# Patient Record
Sex: Male | Born: 1970 | Race: White | Hispanic: No | Marital: Married | Smoking: Current every day smoker
Health system: Southern US, Community
[De-identification: ages and names within clinical notes are randomized; demographics above are authoritative.]

## PROBLEM LIST (undated history)

## (undated) DIAGNOSIS — K219 Gastro-esophageal reflux disease without esophagitis: Secondary | ICD-10-CM

## (undated) DIAGNOSIS — J449 Chronic obstructive pulmonary disease, unspecified: Secondary | ICD-10-CM

## (undated) DIAGNOSIS — G4730 Sleep apnea, unspecified: Secondary | ICD-10-CM

## (undated) DIAGNOSIS — I10 Essential (primary) hypertension: Secondary | ICD-10-CM

## (undated) HISTORY — PX: NO PAST SURGERIES: SHX2092

---

## 2012-02-14 DIAGNOSIS — E291 Testicular hypofunction: Secondary | ICD-10-CM | POA: Insufficient documentation

## 2012-02-14 DIAGNOSIS — N529 Male erectile dysfunction, unspecified: Secondary | ICD-10-CM | POA: Insufficient documentation

## 2015-09-02 ENCOUNTER — Other Ambulatory Visit: Payer: Self-pay | Admitting: Family Medicine

## 2015-09-02 ENCOUNTER — Ambulatory Visit
Admission: RE | Admit: 2015-09-02 | Discharge: 2015-09-02 | Disposition: A | Discharge status: Home / Self Care | Payer: 59 | Source: Ambulatory Visit | Attending: Family Medicine | Admitting: Family Medicine

## 2015-09-02 DIAGNOSIS — M7989 Other specified soft tissue disorders: Secondary | ICD-10-CM | POA: Diagnosis present

## 2015-09-02 DIAGNOSIS — I8001 Phlebitis and thrombophlebitis of superficial vessels of right lower extremity: Secondary | ICD-10-CM | POA: Diagnosis not present

## 2015-09-02 DIAGNOSIS — G8929 Other chronic pain: Secondary | ICD-10-CM | POA: Insufficient documentation

## 2017-08-16 ENCOUNTER — Ambulatory Visit
Admission: RE | Admit: 2017-08-16 | Discharge: 2017-08-16 | Disposition: A | Discharge status: Home / Self Care | Payer: BLUE CROSS/BLUE SHIELD | Source: Ambulatory Visit | Attending: Family Medicine | Admitting: Family Medicine

## 2017-08-16 ENCOUNTER — Other Ambulatory Visit: Payer: Self-pay | Admitting: Family Medicine

## 2017-08-16 DIAGNOSIS — M62830 Muscle spasm of back: Secondary | ICD-10-CM | POA: Diagnosis present

## 2017-08-16 DIAGNOSIS — M5137 Other intervertebral disc degeneration, lumbosacral region: Secondary | ICD-10-CM | POA: Insufficient documentation

## 2017-08-24 DIAGNOSIS — M47818 Spondylosis without myelopathy or radiculopathy, sacral and sacrococcygeal region: Secondary | ICD-10-CM | POA: Insufficient documentation

## 2017-08-30 ENCOUNTER — Other Ambulatory Visit: Payer: Self-pay | Admitting: Physical Medicine and Rehabilitation

## 2017-08-30 DIAGNOSIS — M5416 Radiculopathy, lumbar region: Secondary | ICD-10-CM

## 2017-09-02 ENCOUNTER — Ambulatory Visit
Admission: RE | Admit: 2017-09-02 | Discharge: 2017-09-02 | Disposition: A | Discharge status: Home / Self Care | Payer: BLUE CROSS/BLUE SHIELD | Source: Ambulatory Visit | Attending: Physical Medicine and Rehabilitation | Admitting: Physical Medicine and Rehabilitation

## 2017-09-02 DIAGNOSIS — M5416 Radiculopathy, lumbar region: Secondary | ICD-10-CM | POA: Diagnosis present

## 2017-09-02 DIAGNOSIS — M4316 Spondylolisthesis, lumbar region: Secondary | ICD-10-CM | POA: Diagnosis not present

## 2017-09-02 DIAGNOSIS — M48061 Spinal stenosis, lumbar region without neurogenic claudication: Secondary | ICD-10-CM | POA: Diagnosis not present

## 2017-09-02 DIAGNOSIS — M5116 Intervertebral disc disorders with radiculopathy, lumbar region: Secondary | ICD-10-CM | POA: Insufficient documentation

## 2017-09-11 ENCOUNTER — Ambulatory Visit: Payer: BLUE CROSS/BLUE SHIELD

## 2017-09-20 ENCOUNTER — Other Ambulatory Visit: Payer: Self-pay

## 2017-09-20 ENCOUNTER — Ambulatory Visit
Admission: RE | Admit: 2017-09-20 | Discharge: 2017-09-20 | Disposition: A | Discharge status: Home / Self Care | Payer: BLUE CROSS/BLUE SHIELD | Source: Ambulatory Visit | Attending: Neurological Surgery | Admitting: Neurological Surgery

## 2017-09-20 ENCOUNTER — Encounter
Admission: RE | Admit: 2017-09-20 | Discharge: 2017-09-20 | Disposition: A | Discharge status: Home / Self Care | Payer: BLUE CROSS/BLUE SHIELD | Source: Ambulatory Visit | Attending: Neurological Surgery | Admitting: Neurological Surgery

## 2017-09-20 DIAGNOSIS — Z01810 Encounter for preprocedural cardiovascular examination: Secondary | ICD-10-CM | POA: Diagnosis present

## 2017-09-20 DIAGNOSIS — F17200 Nicotine dependence, unspecified, uncomplicated: Secondary | ICD-10-CM | POA: Diagnosis not present

## 2017-09-20 DIAGNOSIS — I10 Essential (primary) hypertension: Secondary | ICD-10-CM | POA: Insufficient documentation

## 2017-09-20 DIAGNOSIS — J449 Chronic obstructive pulmonary disease, unspecified: Secondary | ICD-10-CM | POA: Diagnosis not present

## 2017-09-20 DIAGNOSIS — Z01812 Encounter for preprocedural laboratory examination: Secondary | ICD-10-CM | POA: Diagnosis present

## 2017-09-20 DIAGNOSIS — G4730 Sleep apnea, unspecified: Secondary | ICD-10-CM | POA: Diagnosis not present

## 2017-09-20 DIAGNOSIS — I4510 Unspecified right bundle-branch block: Secondary | ICD-10-CM | POA: Diagnosis not present

## 2017-09-20 DIAGNOSIS — Z01818 Encounter for other preprocedural examination: Secondary | ICD-10-CM | POA: Insufficient documentation

## 2017-09-20 DIAGNOSIS — K219 Gastro-esophageal reflux disease without esophagitis: Secondary | ICD-10-CM | POA: Insufficient documentation

## 2017-09-20 HISTORY — DX: Sleep apnea, unspecified: G47.30

## 2017-09-20 HISTORY — DX: Chronic obstructive pulmonary disease, unspecified: J44.9

## 2017-09-20 HISTORY — DX: Essential (primary) hypertension: I10

## 2017-09-20 HISTORY — DX: Gastro-esophageal reflux disease without esophagitis: K21.9

## 2017-09-20 LAB — BASIC METABOLIC PANEL
Anion gap: 8 (ref 5–15)
BUN: 15 mg/dL (ref 6–20)
CALCIUM: 9.6 mg/dL (ref 8.9–10.3)
CO2: 26 mmol/L (ref 22–32)
Chloride: 106 mmol/L (ref 98–111)
Creatinine, Ser: 1.03 mg/dL (ref 0.61–1.24)
GFR calc Af Amer: >60 mL/min (ref >60)
GLUCOSE: 101 mg/dL — AB (ref 70–99)
POTASSIUM: 4.5 mmol/L (ref 3.5–5.1)
SODIUM: 140 mmol/L (ref 135–145)

## 2017-09-20 LAB — TYPE AND SCREEN
ABO/RH(D): A POS
Antibody Screen: NEGATIVE

## 2017-09-20 LAB — CBC
HCT: 44.5 % (ref 40.0–52.0)
HEMOGLOBIN: 15.2 g/dL (ref 13.0–18.0)
MCH: 30.9 pg (ref 26.0–34.0)
MCHC: 34.3 g/dL (ref 32.0–36.0)
MCV: 90.2 fL (ref 80.0–100.0)
PLATELETS: 330 10*3/uL (ref 150–440)
RBC: 4.93 MIL/uL (ref 4.40–5.90)
RDW: 13.3 % (ref 11.5–14.5)
WBC: 8.4 10*3/uL (ref 3.8–10.6)

## 2017-09-20 LAB — URINALYSIS, ROUTINE W REFLEX MICROSCOPIC
BILIRUBIN URINE: NEGATIVE
GLUCOSE, UA: NEGATIVE mg/dL
HGB URINE DIPSTICK: NEGATIVE
Ketones, ur: NEGATIVE mg/dL
Leukocytes, UA: NEGATIVE
Nitrite: NEGATIVE
Protein, ur: NEGATIVE mg/dL
SPECIFIC GRAVITY, URINE: 1.021 (ref 1.005–1.030)
pH: 5 (ref 5.0–8.0)

## 2017-09-20 LAB — PROTIME-INR
INR: 0.99
PROTHROMBIN TIME: 13 s (ref 11.4–15.2)

## 2017-09-20 LAB — SURGICAL PCR SCREEN
MRSA, PCR: NEGATIVE
Staphylococcus aureus: NEGATIVE

## 2017-09-20 LAB — APTT: APTT: 28 s (ref 24–36)

## 2017-09-20 NOTE — Patient Instructions (Signed)
Your procedure is scheduled on:09/25/17 Report to Day Surgery. [facility] SECOND FLOOR To find out your arrival time please call [phone] between 1PM - 3PM on 09/22/17  Remember: Instructions that are not followed completely may result in serious medical risk,  up to and including death, or upon the discretion of your surgeon and anesthesiologist your  surgery may need to be rescheduled.     _X__ 1. Do not eat food after midnight the night before your procedure.                 No gum chewing or hard candies. You may drink clear liquids up to 2 hours                 before you are scheduled to arrive for your surgery- DO not drink clear                 liquids within 2 hours of the start of your surgery.                 Clear Liquids include:  water, apple juice without pulp, clear carbohydrate                 drink such as Clearfast of Gatorade, Black Coffee or Tea (Do not add                 anything to coffee or tea).  __X__2.  On the morning of surgery brush your teeth with toothpaste and water, you                may rinse your mouth with mouthwash if you wish.  Do not swallow any toothpaste of mouthwash.     _X__ 3.  No Alcohol for 24 hours before or after surgery.   _X__ 4.  Do Not Smoke or use e-cigarettes For 24 Hours Prior to Your Surgery.                 Do not use any chewable tobacco products for at least 6 hours prior to                 surgery.  ____  5.  Bring all medications with you on the day of surgery if instructed.   __X__  6.  Notify your doctor if there is any change in your medical condition      (cold, fever, infections).     Do not wear jewelry, make-up, hairpins, clips or nail polish. Do not wear lotions, powders, or perfumes. You may wear deodorant. Do not shave 48 hours prior to surgery. Men may shave face and neck. Do not bring valuables to the hospital.    [facility] is not responsible for any belongings or  valuables.  Contacts, dentures or bridgework may not be worn into surgery. Leave your suitcase in the car. After surgery it may be brought to your room. For patients admitted to the hospital, discharge time is determined by your treatment team.   Patients discharged the day of surgery will not be allowed to drive home.   Please read over the following fact sheets that you were given:   Surgical Site Infection Prevention / MRSA   _X___ Take these medicines the morning of surgery with A SIP OF WATER:    1.GEMFIBROZIL  2. ZANTAC AT BEDTIME 09/24/17 AND AM OF SURGERY  3.   4.  5.  6.  ____ Fleet Enema (as directed)  __X__ Use CHG Soap as directed  _X___ Use inhalers on the day of surgery  AND BRING  ____ Stop metformin 2 days prior to surgery    ____ Take 1/2 of usual insulin dose the night before surgery. No insulin the morning          of surgery.   __X__ Stop Coumadin/Plavix/aspirin on   STOP ASPIRIN 09/20/17  __X__ Stop Anti-inflammatories on   STOP IBUPROFEN 09/20/17   ____ Stop supplements until after surgery.    _X___ Bring C-Pap to the hospital.

## 2017-09-25 ENCOUNTER — Ambulatory Visit: Payer: BLUE CROSS/BLUE SHIELD

## 2017-09-25 ENCOUNTER — Encounter
Admission: RE | Disposition: A | Discharge status: Home / Self Care | Payer: Self-pay | Source: Ambulatory Visit | Attending: Neurological Surgery

## 2017-09-25 ENCOUNTER — Ambulatory Visit
Admission: RE | Admit: 2017-09-25 | Discharge: 2017-09-25 | Disposition: A | Discharge status: Home / Self Care | Payer: BLUE CROSS/BLUE SHIELD | Source: Ambulatory Visit | Attending: Neurological Surgery | Admitting: Neurological Surgery

## 2017-09-25 ENCOUNTER — Encounter: Payer: Self-pay | Admitting: *Deleted

## 2017-09-25 ENCOUNTER — Ambulatory Visit: Payer: BLUE CROSS/BLUE SHIELD | Admitting: Anesthesiology

## 2017-09-25 DIAGNOSIS — K219 Gastro-esophageal reflux disease without esophagitis: Secondary | ICD-10-CM | POA: Diagnosis not present

## 2017-09-25 DIAGNOSIS — F17210 Nicotine dependence, cigarettes, uncomplicated: Secondary | ICD-10-CM | POA: Diagnosis not present

## 2017-09-25 DIAGNOSIS — M5116 Intervertebral disc disorders with radiculopathy, lumbar region: Secondary | ICD-10-CM | POA: Insufficient documentation

## 2017-09-25 DIAGNOSIS — I10 Essential (primary) hypertension: Secondary | ICD-10-CM | POA: Insufficient documentation

## 2017-09-25 DIAGNOSIS — Z79899 Other long term (current) drug therapy: Secondary | ICD-10-CM | POA: Diagnosis not present

## 2017-09-25 DIAGNOSIS — E785 Hyperlipidemia, unspecified: Secondary | ICD-10-CM | POA: Diagnosis not present

## 2017-09-25 DIAGNOSIS — Z419 Encounter for procedure for purposes other than remedying health state, unspecified: Secondary | ICD-10-CM

## 2017-09-25 DIAGNOSIS — J449 Chronic obstructive pulmonary disease, unspecified: Secondary | ICD-10-CM | POA: Insufficient documentation

## 2017-09-25 HISTORY — PX: LUMBAR LAMINECTOMY/DECOMPRESSION MICRODISCECTOMY: SHX5026

## 2017-09-25 LAB — URINE DRUG SCREEN, QUALITATIVE (ARMC ONLY)
Amphetamines, Ur Screen: NOT DETECTED
Benzodiazepine, Ur Scrn: NOT DETECTED
COCAINE METABOLITE, UR ~~LOC~~: NOT DETECTED
Cannabinoid 50 Ng, Ur ~~LOC~~: NOT DETECTED
MDMA (ECSTASY) UR SCREEN: NOT DETECTED
Methadone Scn, Ur: NOT DETECTED
Opiate, Ur Screen: NOT DETECTED
Phencyclidine (PCP) Ur S: NOT DETECTED
TRICYCLIC, UR SCREEN: NOT DETECTED

## 2017-09-25 LAB — ABO/RH: ABO/RH(D): A POS

## 2017-09-25 SURGERY — LUMBAR LAMINECTOMY/DECOMPRESSION MICRODISCECTOMY 1 LEVEL
Anesthesia: General | Site: Back | Wound class: Clean

## 2017-09-25 MED ORDER — BUPIVACAINE-EPINEPHRINE (PF) 0.5% -1:200000 IJ SOLN
INTRAMUSCULAR | Status: AC
  Filled 2017-09-25: qty 30

## 2017-09-25 MED ORDER — PROPOFOL 10 MG/ML IV BOLUS
INTRAVENOUS | Status: AC
  Filled 2017-09-25: qty 20

## 2017-09-25 MED ORDER — REMIFENTANIL HCL 1 MG IV SOLR
INTRAVENOUS | Status: DC | PRN
  Administered 2017-09-25: .05 ug/kg/min via INTRAVENOUS

## 2017-09-25 MED ORDER — METHOCARBAMOL 500 MG PO TABS: 500.0000 mg | ORAL_TABLET | Freq: Four times a day (QID) | ORAL | 0 refills | Status: DC | PRN

## 2017-09-25 MED ORDER — OXYCODONE HCL 5 MG PO TABS: 5.0000 mg | ORAL_TABLET | ORAL | 0 refills | Status: DC | PRN

## 2017-09-25 MED ORDER — ONDANSETRON HCL 4 MG/2ML IJ SOLN
INTRAMUSCULAR | Status: DC | PRN
  Administered 2017-09-25: 4 mg via INTRAVENOUS

## 2017-09-25 MED ORDER — LIDOCAINE HCL (PF) 2 % IJ SOLN
INTRAMUSCULAR | Status: AC
  Filled 2017-09-25: qty 10

## 2017-09-25 MED ORDER — SUCCINYLCHOLINE CHLORIDE 20 MG/ML IJ SOLN
INTRAMUSCULAR | Status: DC | PRN
  Administered 2017-09-25: 120 mg via INTRAVENOUS

## 2017-09-25 MED ORDER — SUGAMMADEX SODIUM 200 MG/2ML IV SOLN
INTRAVENOUS | Status: DC | PRN
  Administered 2017-09-25: 200 mg via INTRAVENOUS

## 2017-09-25 MED ORDER — FENTANYL CITRATE (PF) 100 MCG/2ML IJ SOLN
INTRAMUSCULAR | Status: DC | PRN
  Administered 2017-09-25 (×3): 50 ug via INTRAVENOUS

## 2017-09-25 MED ORDER — OXYCODONE HCL 5 MG PO TABS
ORAL_TABLET | ORAL | Status: AC
  Administered 2017-09-25: 5 mg
  Filled 2017-09-25: qty 1

## 2017-09-25 MED ORDER — LACTATED RINGERS IV SOLN
INTRAVENOUS | Status: DC
  Administered 2017-09-25 (×2): via INTRAVENOUS

## 2017-09-25 MED ORDER — REMIFENTANIL HCL 1 MG IV SOLR
INTRAVENOUS | Status: AC
  Filled 2017-09-25: qty 1000

## 2017-09-25 MED ORDER — ROCURONIUM BROMIDE 100 MG/10ML IV SOLN
INTRAVENOUS | Status: DC | PRN
  Administered 2017-09-25 (×2): 20 mg via INTRAVENOUS
  Administered 2017-09-25: 10 mg via INTRAVENOUS
  Administered 2017-09-25: 30 mg via INTRAVENOUS
  Administered 2017-09-25: 10 mg via INTRAVENOUS

## 2017-09-25 MED ORDER — ROCURONIUM BROMIDE 50 MG/5ML IV SOLN
INTRAVENOUS | Status: AC
  Filled 2017-09-25: qty 2

## 2017-09-25 MED ORDER — THROMBIN 5000 UNITS EX SOLR
CUTANEOUS | Status: AC
  Filled 2017-09-25: qty 5000

## 2017-09-25 MED ORDER — FENTANYL CITRATE (PF) 100 MCG/2ML IJ SOLN
INTRAMUSCULAR | Status: AC
  Administered 2017-09-25: 25 ug via INTRAVENOUS
  Filled 2017-09-25: qty 2

## 2017-09-25 MED ORDER — ACETAMINOPHEN 10 MG/ML IV SOLN
INTRAVENOUS | Status: AC
  Filled 2017-09-25: qty 100

## 2017-09-25 MED ORDER — FENTANYL CITRATE (PF) 100 MCG/2ML IJ SOLN
INTRAMUSCULAR | Status: AC
  Filled 2017-09-25: qty 2

## 2017-09-25 MED ORDER — MIDAZOLAM HCL 2 MG/2ML IJ SOLN
INTRAMUSCULAR | Status: DC | PRN
  Administered 2017-09-25: 2 mg via INTRAVENOUS

## 2017-09-25 MED ORDER — SEVOFLURANE IN SOLN
RESPIRATORY_TRACT | Status: AC
  Filled 2017-09-25: qty 250

## 2017-09-25 MED ORDER — SUGAMMADEX SODIUM 200 MG/2ML IV SOLN
INTRAVENOUS | Status: AC
  Filled 2017-09-25: qty 2

## 2017-09-25 MED ORDER — ALBUTEROL SULFATE HFA 108 (90 BASE) MCG/ACT IN AERS
INHALATION_SPRAY | RESPIRATORY_TRACT | Status: DC | PRN
  Administered 2017-09-25: 10 via RESPIRATORY_TRACT
  Administered 2017-09-25: 4 via RESPIRATORY_TRACT

## 2017-09-25 MED ORDER — CEFAZOLIN SODIUM-DEXTROSE 2-4 GM/100ML-% IV SOLN
2.0000 g | INTRAVENOUS | Status: AC
  Administered 2017-09-25: 2 g via INTRAVENOUS

## 2017-09-25 MED ORDER — ONDANSETRON HCL 4 MG/2ML IJ SOLN: 4.0000 mg | Freq: Once | INTRAMUSCULAR | Status: DC | PRN

## 2017-09-25 MED ORDER — CEFAZOLIN SODIUM-DEXTROSE 2-4 GM/100ML-% IV SOLN
INTRAVENOUS | Status: AC
  Filled 2017-09-25: qty 100

## 2017-09-25 MED ORDER — SUCCINYLCHOLINE CHLORIDE 20 MG/ML IJ SOLN
INTRAMUSCULAR | Status: AC
  Filled 2017-09-25: qty 1

## 2017-09-25 MED ORDER — DEXAMETHASONE SODIUM PHOSPHATE 10 MG/ML IJ SOLN
INTRAMUSCULAR | Status: DC | PRN
  Administered 2017-09-25: 10 mg via INTRAVENOUS

## 2017-09-25 MED ORDER — FENTANYL CITRATE (PF) 100 MCG/2ML IJ SOLN
25.0000 ug | INTRAMUSCULAR | Status: DC | PRN
  Administered 2017-09-25 (×2): 25 ug via INTRAVENOUS

## 2017-09-25 MED ORDER — OXYCODONE HCL 5 MG PO TABS
5.0000 mg | ORAL_TABLET | ORAL | Status: DC | PRN
Start: 2017-09-25 — End: 2017-09-25
  Filled 2017-09-25: qty 1

## 2017-09-25 MED ORDER — BUPIVACAINE-EPINEPHRINE 0.5% -1:200000 IJ SOLN
INTRAMUSCULAR | Status: DC | PRN
  Administered 2017-09-25: 10 mL

## 2017-09-25 MED ORDER — ONDANSETRON HCL 4 MG/2ML IJ SOLN
INTRAMUSCULAR | Status: AC
  Filled 2017-09-25: qty 2

## 2017-09-25 MED ORDER — DEXAMETHASONE SODIUM PHOSPHATE 10 MG/ML IJ SOLN
INTRAMUSCULAR | Status: AC
  Filled 2017-09-25: qty 1

## 2017-09-25 MED ORDER — LIDOCAINE HCL (CARDIAC) PF 100 MG/5ML IV SOSY
PREFILLED_SYRINGE | INTRAVENOUS | Status: DC | PRN
  Administered 2017-09-25: 60 mg via INTRAVENOUS

## 2017-09-25 MED ORDER — ROCURONIUM BROMIDE 50 MG/5ML IV SOLN
INTRAVENOUS | Status: AC
  Filled 2017-09-25: qty 1

## 2017-09-25 MED ORDER — ACETAMINOPHEN 10 MG/ML IV SOLN
INTRAVENOUS | Status: DC | PRN
  Administered 2017-09-25: 1000 mg via INTRAVENOUS

## 2017-09-25 MED ORDER — MIDAZOLAM HCL 2 MG/2ML IJ SOLN
INTRAMUSCULAR | Status: AC
  Filled 2017-09-25: qty 2

## 2017-09-25 MED ORDER — PROPOFOL 10 MG/ML IV BOLUS
INTRAVENOUS | Status: DC | PRN
  Administered 2017-09-25: 160 mg via INTRAVENOUS

## 2017-09-25 SURGICAL SUPPLY — 50 items
BUR NEURO DRILL SOFT 3.0X3.8M (BURR) ×3 IMPLANT
CANISTER SUCT 1200ML W/VALVE (MISCELLANEOUS) ×6 IMPLANT
CHLORAPREP W/TINT 26ML (MISCELLANEOUS) ×6 IMPLANT
CNTNR SPEC 2.5X3XGRAD LEK (MISCELLANEOUS) ×1
CONT SPEC 4OZ STER OR WHT (MISCELLANEOUS) ×2
CONTAINER SPEC 2.5X3XGRAD LEK (MISCELLANEOUS) ×1 IMPLANT
COUNTER NEEDLE 20/40 LG (NEEDLE) ×3 IMPLANT
COVER LIGHT HANDLE STERIS (MISCELLANEOUS) ×6 IMPLANT
CRADLE LAMINECT ARM (MISCELLANEOUS) ×3 IMPLANT
CUP MEDICINE 2OZ PLAST GRAD ST (MISCELLANEOUS) ×6 IMPLANT
DERMABOND ADVANCED (GAUZE/BANDAGES/DRESSINGS) ×2
DERMABOND ADVANCED .7 DNX12 (GAUZE/BANDAGES/DRESSINGS) ×1 IMPLANT
DRAPE C-ARM 42X72 X-RAY (DRAPES) ×6 IMPLANT
DRAPE LAPAROTOMY 100X77 ABD (DRAPES) ×3 IMPLANT
DRAPE MICROSCOPE SPINE 48X150 (DRAPES) ×3 IMPLANT
DRAPE POUCH INSTRU U-SHP 10X18 (DRAPES) ×3 IMPLANT
DRAPE SURG 17X11 SM STRL (DRAPES) ×12 IMPLANT
ELECT CAUTERY BLADE TIP 2.5 (TIP) ×3
ELECT EZSTD 165MM 6.5IN (MISCELLANEOUS)
ELECT REM PT RETURN 9FT ADLT (ELECTROSURGICAL) ×3
ELECTRODE CAUTERY BLDE TIP 2.5 (TIP) ×1 IMPLANT
ELECTRODE EZSTD 165MM 6.5IN (MISCELLANEOUS) IMPLANT
ELECTRODE REM PT RTRN 9FT ADLT (ELECTROSURGICAL) ×1 IMPLANT
GLOVE BIOGEL M 6.5 STRL (GLOVE) ×3 IMPLANT
GLOVE BIOGEL PI IND STRL 7.0 (GLOVE) ×1 IMPLANT
GLOVE BIOGEL PI INDICATOR 7.0 (GLOVE) ×2
GLOVE SURG SYN 8.0 (GLOVE) ×3 IMPLANT
GOWN STRL REUS W/ TWL LRG LVL3 (GOWN DISPOSABLE) ×2 IMPLANT
GOWN STRL REUS W/TWL LRG LVL3 (GOWN DISPOSABLE) ×4
GOWN STRL REUS W/TWL MED LVL3 (GOWN DISPOSABLE) ×3 IMPLANT
KIT TURNOVER KIT A (KITS) ×3 IMPLANT
KIT WILSON FRAME (KITS) ×3 IMPLANT
NDL SAFETY ECLIPSE 18X1.5 (NEEDLE) ×1 IMPLANT
NEEDLE HYPO 18GX1.5 SHARP (NEEDLE) ×2
NEEDLE HYPO 22GX1.5 SAFETY (NEEDLE) ×3 IMPLANT
NS IRRIG 1000ML POUR BTL (IV SOLUTION) ×3 IMPLANT
PACK LAMINECTOMY NEURO (CUSTOM PROCEDURE TRAY) ×3 IMPLANT
SPOGE SURGIFLO 8M (HEMOSTASIS) ×2
SPONGE SURGIFLO 8M (HEMOSTASIS) ×1 IMPLANT
SUT VIC AB 0 CT1 18XCR BRD 8 (SUTURE) ×2 IMPLANT
SUT VIC AB 0 CT1 8-18 (SUTURE) ×4
SUT VIC AB 2-0 CT1 18 (SUTURE) ×6 IMPLANT
SUT VIC AB 3-0 SH 8-18 (SUTURE) ×3 IMPLANT
SYR 10ML LL (SYRINGE) ×3 IMPLANT
SYR 20CC LL (SYRINGE) ×3 IMPLANT
SYR 30ML LL (SYRINGE) ×6 IMPLANT
SYR 3ML LL SCALE MARK (SYRINGE) ×3 IMPLANT
TOWEL OR 17X26 4PK STRL BLUE (TOWEL DISPOSABLE) ×9 IMPLANT
TUBING CONNECTING 10 (TUBING) ×2 IMPLANT
TUBING CONNECTING 10' (TUBING) ×1

## 2017-09-25 NOTE — Anesthesia Post-op Follow-up Note (Signed)
Anesthesia QCDR form completed.        

## 2017-09-25 NOTE — Op Note (Signed)
[facility] [birth year] [facility] [phone]  Neurosurgery Operative Note  Date of Procedure: September 25, 2017  Location: Main Operating Room 3  Staff: Kolbie Clarkston A. Bradley Ferris, M.D.  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  The nature, risks, and alternatives of the various management options have been extensively reviewed with the patient.  The patient understands the risks include but are not limited to pain, bleeding, infection, coma, stroke, death, paralysis, weakness, numbness, persistence of symptoms, recurrence of symptoms, reherniation of disc at the same or different level/side, cerebrospinal fluid leak, need for additional surgery, on-label and off-label use of medications and biological materials.  He understands his back pain will not improve with surgical decompression.  All questions answered in lay terms; the patient wishes to proceed; consent signed and on chart.  Description of Procedure: The patient was brought to the operating room and kept supine on the transport gurney.  General oroendotracheal anesthesia was induced by the anesthesia service.  The patient was positioned prone on a Wilson frame on the operating room table.  All pressure points were padded and the patient secured to the OR table with safety straps and tape.  The L4-5 interspace was marked with fluoroscopy.  The patient was prepped and draped in usual sterile fashion.  A timeout was performed,  identifying the correct patient, the correct procedure, and the correct location.  The incision was infiltrated with local anesthetic and opened sharply; a small self-retaining retractor was placed.  The lumbar fascia was identified and entered with monopolar cautery.  The paraspinal musculature was elevated in subperiosteal fashion with a periosteal elevator and monopolar cautery.  Fluoroscopy was used to confirm the level of exposure to be L4-5.  A MCullough retractor was placed; a large laminotomy was made in the right L4 lamina with the high-speed drill.  The bone edges were waxed.  The ligamentum flavum was removed in piecemeal fashion using Kerrison rongeurs.  The L5 nerve root was identified and found to be elevated and non-pulsatile at inital exposure due to the presence of a large herniated disc fragment ventral to the thecal sac.  The nerve root and thecal sac were gently retracted medially.  Epidural vessels overlying the herniated disc were cauterized with bipolar cautery.   The disc was incised in cruciate fashion with a #15 blade and several small fragments of intervertebral disc were removed with a pituitary rongeur.  The amount of disc material was not consistent with the size of the herniation on imaging.  A blunt nerve hook was used to explore deep to the thecal sac rostral to the disc space.  A piece of disc material was identified.  The tissue overlying this divided bluntly and disc material seen.  It was grasped with a pituitary rongeur and gently mobilized.  It was part of a very large herniated fragment.  Multiple adhesions and fibrosis were encountered when trying to mobilize the disc fragment.  Eventually the majority of these were released and a very large disc fragment delivered.  The epidural space was extensively explored  following this and no additional free fragments of disc material were identified.  Extensive epidural fibrosis with annular degenerated fibers were seen centrally.   The disc space was irrigated and no further free fragments identified.  The neural foramen was probed and the L5 nerve root confirmed to be well decompressed.  The wound was copiously irrigated; the neural elements were confirmed to be well decompressed and pulsatile.  Hemostasis was achieved with bipolar cautery and SurgiFlo.  Surgicel and thrombin was placed over the exposed thecal sac and nerve root.  The fascia was reapproximated using interrupted 0 Vicryl suture.  The deep subcutaneous tissue was reapproximated with interrupted 2-0 Vicryl suture.  The dermis was reapproximated with inverted interrupted 3-0 vicryl suture.  The skin was closed with skin glue.  A sterile dressing was applied.  The patient was returned to a supine position on the hospital gurney, extubated, and taken to PACU in good condition.  All sponge and needle counts were correct at the end of the procedure.  Dr. Riley NearingSeverson was present for the entire procedure.

## 2017-09-25 NOTE — Transfer of Care (Signed)
Immediate Anesthesia Transfer of Care Note  Patient: Keith Shaw  Procedure(s) Performed: Procedure(s): LUMBAR LAMINECTOMY/DECOMPRESSION MICRODISCECTOMY 1 LEVEL-L4 (N/A)  Patient Location: PACU  Anesthesia Type:General  Level of Consciousness: sedated  Airway & Oxygen Therapy: Patient Spontanous Breathing and Patient connected to face mask oxygen  Post-op Assessment: Report given to RN and Post -op Vital signs reviewed and stable  Post vital signs: Reviewed and stable  Last Vitals:  Vitals:   09/25/17 0822 09/25/17 1504  BP: (!) 148/87 (!) 144/85  Pulse: 76 89  Resp: 16 20  Temp: 36.5 C 36.5 C  SpO2: 98% 99%    Complications: No apparent anesthesia complications

## 2017-09-25 NOTE — Anesthesia Preprocedure Evaluation (Signed)
Anesthesia Evaluation  Patient identified by MRN, date of birth, ID band Patient awake    Reviewed: Allergy & Precautions, H&P , NPO status , Patient's Chart, lab work & pertinent test results, reviewed documented beta blocker date and time   Airway Mallampati: III  TM Distance: >3 FB Neck ROM: full    Dental  (+) Teeth Intact   Pulmonary neg pulmonary ROS, sleep apnea , COPD, Current Smoker,    Pulmonary exam normal        Cardiovascular Exercise Tolerance: Good hypertension, On Medications negative cardio ROS Normal cardiovascular exam Rhythm:regular Rate:Normal     Neuro/Psych negative neurological ROS  negative psych ROS   GI/Hepatic negative GI ROS, Neg liver ROS, GERD  Medicated,  Endo/Other  negative endocrine ROS  Renal/GU negative Renal ROS  negative genitourinary   Musculoskeletal   Abdominal   Peds  Hematology negative hematology ROS (+)   Anesthesia Other Findings Past Medical History: No date: COPD (chronic obstructive pulmonary disease) (HCC) No date: GERD (gastroesophageal reflux disease) No date: Hypertension No date: Sleep apnea Past Surgical History: No date: NO PAST SURGERIES BMI    Body Mass Index:  32.64 kg/m     Reproductive/Obstetrics negative OB ROS                             Anesthesia Physical Anesthesia Plan  ASA: III  Anesthesia Plan: General ETT   Post-op Pain Management:    Induction:   PONV Risk Score and Plan: 2  Airway Management Planned:   Additional Equipment:   Intra-op Plan:   Post-operative Plan:   Informed Consent: I have reviewed the patients History and Physical, chart, labs and discussed the procedure including the risks, benefits and alternatives for the proposed anesthesia with the patient or authorized representative who has indicated his/her understanding and acceptance.   Dental Advisory Given  Plan Discussed with:  CRNA  Anesthesia Plan Comments:         Anesthesia Quick Evaluation

## 2017-09-25 NOTE — H&P (Signed)
Keith Shaw, Keith Grandison Alton III, MD - 09/11/2017 1:45 PM EDT Formatting of this note might be different from the original.   Referring Physician:  Burman FreestoneMeeler, Whitney, NP [address] [place], [place] [id number]  Primary Physician:  Dione Housekeeperlmedo, Mario Ernesto, MD  DATE: 09/11/2017   RE: Keith Shaw William Philippi Date of Birth: 1971-03-11 Medical record number: [id number]  Patient Care Team: Dione Housekeeperlmedo, Mario Ernesto, MD as PCP - General (Family Medicine)  REQUESTED BY: Stark FallsMeeler, Alphonzo LemmingsWhitney, Np [address] [place], [place] [address]  REASON FOR VISIT:  Onalee HuaDavid had concerns including Establish Care (right low back pain, right buttock pain. Went to PT one visit, but too painful. Has tried 1 injection on 09/08/17 - pain not as intense ).  HISTORY OF PRESENT ILLNESS:  Keith LedgerDavid William Koenigs is a 47 y.o. male referred for neurosurgical evaluation of low back as well as right buttock pain that started approximately Aug 01, 2017. Patient noted soreness in the left low back in the next day when he awoke he had severe pain in the right buttock. His current complaint is pain in the low back as well as pain in the right buttock. He denies pain radiating to the thigh, leg, and foot. He denies sensory change in the right lower extremity. He denies weakness of the right lower extremity. He attempted physical therapy however stopped due to increasing pain. He was seen by physiatry and underwent an epidural steroid injection on September 07, 2017. He reports that this did improve his pain significantly however he continues to have pain constantly. His pain is aggravated with activity. His pain is improved with rest. No incontinence.  Physical Therapy: One visit; stopped; too painful Pain Management: Right S1 transforaminal ESI 09/07/17  PAST MEDICAL HISTORY:  Past Medical History:  Diagnosis Date  . COPD (chronic obstructive pulmonary disease) , unspecified (CMS-HCC)  . GERD (gastroesophageal reflux disease)  . Hyperlipidemia   . Hypertension   PAST SURGICAL HISTORY:  Past Surgical History:  Procedure Laterality Date  . Wisdom teeth surgery   CURRENT MEDICATIONS:   Current Outpatient Medications:  . albuterol (VENTOLIN HFA) 90 mcg/actuation inhaler, Inhale 2 inhalations into the lungs every 4 (four) hours as needed for Wheezing, Disp: 1 Inhaler, Rfl: 11 . budesonide-formoterol (SYMBICORT) 160-4.5 mcg/actuation inhaler, Inhale 2 inhalations into the lungs 2 (two) times daily, Disp: 1 Inhaler, Rfl: 3 . clobetasol (TEMOVATE) 0.05 % ointment, Apply topically 2 (two) times daily., Disp: 30 g, Rfl: 1 . gemfibrozil (LOPID) 600 mg tablet, Take 1 tablet (600 mg total) by mouth 2 (two) times daily before meals, Disp: 60 tablet, Rfl: 11 . HYDROcodone-acetaminophen (NORCO) 10-325 mg tablet, Take 1 tablet by mouth 2 (two) times daily as needed, Disp: 10 tablet, Rfl: 0 . lisinopril (PRINIVIL,ZESTRIL) 20 MG tablet, Take 0.5 tablets (10 mg total) by mouth once daily, Disp: 15 tablet, Rfl: 11 . MULTIVITAMIN ORAL, Take by mouth., Disp: , Rfl:  . ranitidine (ZANTAC) 150 MG tablet, Take 150 mg by mouth 2 (two) times daily, Disp: , Rfl:   ALLERGIES:  Allergies  Allergen Reactions  . Citrus And Derivatives Angioedema   SOCIAL HISTORY: Social History   Socioeconomic History  . Marital status: Married  Spouse name: Toniann FailWendy  . Number of children: 1  . Years of education: 8212  . Highest education level: Not on file  Occupational History  . 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 Financial resource strain: Not on file  . Food insecurity:  Worry: Not on file  Inability: Not on file  . Transportation needs:  Medical: Not on file  Non-medical: Not on file  Tobacco Use  . Smoking status: Current Every Day Smoker  Packs/day: 1.00  Years: 27.00  Pack years: 27.00  Types: Cigarettes  . Smokeless tobacco: Never Used  Substance and Sexual Activity  . Alcohol use: Yes  Frequency: 2-4 times a month  Drinks per  session: 7 to 9  Comment: 8-10 drinks on the weekends  . Drug use: No  . Sexual activity: Yes  Partners: Female  Birth control/protection: None, Surgical  Other Topics Concern  . Not on file  Social History Narrative  He lives with his wife, he had a son from a different marriage and he is 19 years of age. Very little physical activity outside of work. No caffeinated drinks   He reports that he has been smoking cigarettes. He has a 27.00 pack-year smoking history. He has never used smokeless tobacco. He reports that he drinks alcohol. He reports that he does not use drugs. Married [2]  Social History   Social History Narrative  He lives with his wife, he had a son from a different marriage and he is 61 years of age. Very little physical activity outside of work. No caffeinated drinks   FAMILY MEDICAL HISTORY:  his family history includes Alzheimer's disease in his mother; Diabetes type II in his father and mother; High blood pressure (Hypertension) in his father and mother; Thyroid disease in his mother.  REVIEW OF SYSTEMS:  General Cardiovascular Neurological  Recent fever/chills []  Calf pain with walking []  Confusion []   Recent weight loss []  Chest pain []  Memory loss []   Skin Leg swelling []  Dizziness []   Bruising []  Irregular Heartbeat []  Fainting []   Rash []  Gastrointestinal Headaches []   Eyes/ Ears / Nose / Mouth / Throat Abdominal pain []  Balance problems []   Mouth sores []  Blood in stools []  Numbness/tingling []   Change/blurring vision []  Diarrhea []  Seizures []   Double vision []  Nausea []  Psychiatric  Ear infections []  Vomiting []  Anxiety []   Hearing loss []  Genitourinary Depression []   Ringing in ears []  Painful urination []  Endocrine  Vertigo []  Blood in urine []  Excessive thirst []   Nasal Congestion []  Incontinence []  Cold/heat intolerance []   Recent sore throat []  Musculoskeletal Low blood sugar []   Respiratory Joint pain or swelling []  Hormone replacement []    Cough []  Muscle pain [x]  Steroid use []   Shortness of breath []  Hematologic  Excessive bleeding []    PHYSICAL EXAM: BP 130/77  Pulse 86  Ht 174.4 cm (5' 8.66")  Wt 98.9 kg (218 lb)  BMI 32.51 kg/m  Vitals:  09/11/17 1346  BP: 130/77  Pulse: 86   GENERAL: Well-developed well-nourished, in no apparent distress.  MENTAL STATUS: AAOx3  PULM: CTAB CV: RRR ABD: S/NABS DERM: Varicose veins in the right lateral leg  MOTOR: 5/5  SENSATION:  Hypesthesia: None Hypalgesia: none MS: no TTP of the lumbar spine   FUNCTIONAL TESTING: Straight leg raise: negative FABER: negative Axial flexion to left due to severe left psoas spasm  DTRs:  2+ and symmetric throughout the lower extremities Clonus: negative Babinski: down  GAIT:  Casual gait: Uses cane for assistance   MEDICAL DECISION MAKING  IMAGING: MRI of the Lumbar Spine from was reviewed in detail with the patient. This shows bilateral L5 pars defects. There is a small disc herniation at L3-4 with left lateral recess stenosis. There is a large disc herniation at  L4-5 on the right with cephalad migration of the disc fragment causing canal stenosis and compression of the bilateral L5 nerve roots.  Lumbar radiographs show a left curve in the coronal plane due to psoas muscle spasm. There is no evidence of abnormal motion with flexion-extension. There are multilevel facet degenerative changes.  ASSESSMENT/PLAN: 47 y.o. male with L4-5 herniated intervertebral disc with left psoas spasm as well as pain in the right buttock. There is no evidence of radiculopathy extending below the buttock. Majority of time spent counseling patient on complex neurologic system disease. This included discussion of how diagnosis is made, review of pertinent imaging studies and examination findings, as well as scientific basis for the differential diagnosis. In addition, the patient was counseled about medical and surgical options for treatment.  Included in this discussion were the indications for treatment, alternative treatments available, likelihood of success, risks associated with all treatment options, and long-term prognosis. All questions were answered, and the patient expressed understanding of the prescribed plan. No barriers to comprehension.  The patient wishes to proceed with surgical decompression.  We plan for right L4 hemilaminectomy with right L4-5 discectomy and nerve root decompression.  He understands that surgery will not improve his back pain.  The patient understands the risks include but are not limited to pain, bleeding, infection, coma, stroke, death, paralysis, weakness, numbness, persistence of symptoms, recurrence of symptoms, recurrence of herniation, cerebrospinal fluid leak, need for additional surgery. The patient wishes to proceed; all questions answered in lay terms and detail.    PHYSICIAN'S VERIFICATION OF INFORMED CONSENT  The risks and benefits of surgery were reviewed in detail with the patient, and present family and friends.  They understand these risks and agree to proceed with surgery.  The consent was signed   Electronically signed by:    Ninfa Meeker, 09/25/2017 10:50 AM    Trayden Brandy Nestor Lewandowsky, MD Dept. of Neurosurgery

## 2017-09-25 NOTE — Brief Op Note (Signed)
09/25/2017  2:56 PM  PATIENT:  Keith Shaw  47 y.o. male  PRE-OPERATIVE DIAGNOSIS:  LUMBAR HERNIATED DISC  POST-OPERATIVE DIAGNOSIS:  LUMBAR HERNIATED DISC  PROCEDURE:  Procedure(s): LUMBAR LAMINECTOMY/DECOMPRESSION MICRODISCECTOMY 1 LEVEL-L4 (N/A)  SURGEON:  Surgeon(s) and Role:    * Severson, Meryl A, MD - Primary  PHYSICIAN ASSISTANT: Ivar DrapeAmanda Ferri, PA-C  ASSISTANTS: none   ANESTHESIA:   general  EBL:  25cc   BLOOD ADMINISTERED:none  DRAINS: none   LOCAL MEDICATIONS USED:  LIDOCAINE   SPECIMEN:  No Specimen  DISPOSITION OF SPECIMEN:  N/A  COUNTS:  YES  TOURNIQUET:  * No tourniquets in log *  DICTATION: .Dragon Dictation  PLAN OF CARE: Discharge to home after PACU  PATIENT DISPOSITION:  PACU - hemodynamically stable.   Delay start of Pharmacological VTE agent (>24hrs) due to surgical blood loss or risk of bleeding: yes

## 2017-09-25 NOTE — Anesthesia Procedure Notes (Signed)
Procedure Name: Intubation Date/Time: 09/25/2017 11:07 AM Performed by: Irving BurtonBachich, Tracer Gutridge, CRNA Pre-anesthesia Checklist: Patient identified, Emergency Drugs available, Suction available and Patient being monitored Patient Re-evaluated:Patient Re-evaluated prior to induction Oxygen Delivery Method: Circle system utilized Preoxygenation: Pre-oxygenation with 100% oxygen Induction Type: IV induction Ventilation: Mask ventilation without difficulty Laryngoscope Size: McGraph and 4 Grade View: Grade I Tube type: Oral Tube size: 7.5 mm Number of attempts: 1 Airway Equipment and Method: Stylet and Video-laryngoscopy Placement Confirmation: ETT inserted through vocal cords under direct vision,  positive ETCO2 and breath sounds checked- equal and bilateral Secured at: 23 cm Tube secured with: Tape Dental Injury: Teeth and Oropharynx as per pre-operative assessment  Difficulty Due To: Difficulty was anticipated

## 2017-09-25 NOTE — Discharge Instructions (Signed)
No lifting greater than 10 pounds for the next 6 weeks.  No repetitive bending, twisting, or jumping.  No strenuous physical activity. You may walk as much as tolerated You may remove your dressing on your second postoperative day.  You may shower on your third postoperative day. It is ok to get the incision wet No soaking in tub or pool for 4 weeks post op or until incision is completely healed.  You cannot drive until seen in follow up or until off narcotics (pain medications) You may resume NSAIDS such as aspirin, ibuprofen, naproxen or blood thinners 7 days after surgery unless directed differently. Take Senna or miralax for stool softener to prevent constipation while on narcotic pain medicine. If no results then use a OTC glycerin suppository. If still no results after a suppository, please call the office to discuss Follow up in office in 10-14 days--if you do not already have an appointment, call [phone] and ask to speak with Patty. Call if you have any concerns or questions, or if you develop any redness, swelling or drainage from your incision; during normal business hours call [phone] and ask for Royston CowperKendelyn; after business hours call [phone] and ask to speak with the neurosurgeon on call.   AMBULATORY SURGERY  DISCHARGE INSTRUCTIONS   1) The drugs that you were given will stay in your system until tomorrow so for the next 24 hours you should not:  A) Drive an automobile B) Make any legal decisions C) Drink any alcoholic beverage   2) You may resume regular meals tomorrow.  Today it is better to start with liquids and gradually work up to solid foods.  You may eat anything you prefer, but it is better to start with liquids, then soup and crackers, and gradually work up to solid foods.   3) Please notify your doctor immediately if you have any unusual bleeding, trouble breathing, redness and pain at the surgery site, drainage, fever, or pain not relieved by  medication.    4) Additional Instructions: TAKE A STOOL SOFTENER TWICE A DAY WHILE TAKING NARCOTIC PAIN MEDICINE TO PREVENT CONSTIPATION   Please contact your physician with any problems or Same Day Surgery at [phone], Monday through Friday 6 am to 4 pm, or [place] [facility] Main number at [phone].

## 2017-09-26 ENCOUNTER — Encounter: Payer: Self-pay | Admitting: Neurological Surgery

## 2017-09-26 NOTE — Anesthesia Postprocedure Evaluation (Signed)
Anesthesia Post Note  Patient: Keith Shaw  Procedure(s) Performed: LUMBAR LAMINECTOMY/DECOMPRESSION MICRODISCECTOMY 1 LEVEL-L4 (N/A Back)  Patient location during evaluation: PACU Anesthesia Type: General Level of consciousness: awake and alert Pain management: pain level controlled Vital Signs Assessment: post-procedure vital signs reviewed and stable Respiratory status: spontaneous breathing, nonlabored ventilation, respiratory function stable and patient connected to nasal cannula oxygen Cardiovascular status: blood pressure returned to baseline and stable Postop Assessment: no apparent nausea or vomiting Anesthetic complications: no     Last Vitals:  Vitals:   09/25/17 1611 09/25/17 1647  BP: (!) 142/83 (!) 151/76  Pulse: 73 72  Resp: 16   Temp: (!) 35.9 C   SpO2: 98% 98%    Last Pain:  Vitals:   09/25/17 1647  TempSrc:   PainSc: 6                  Yevette EdwardsJames G Amiyah Shryock

## 2018-12-04 ENCOUNTER — Encounter (INDEPENDENT_AMBULATORY_CARE_PROVIDER_SITE_OTHER): Payer: Self-pay | Admitting: Nurse Practitioner

## 2018-12-04 ENCOUNTER — Encounter (INDEPENDENT_AMBULATORY_CARE_PROVIDER_SITE_OTHER): Payer: Self-pay

## 2018-12-04 ENCOUNTER — Ambulatory Visit (INDEPENDENT_AMBULATORY_CARE_PROVIDER_SITE_OTHER): Payer: BC Managed Care – PPO | Admitting: Nurse Practitioner

## 2018-12-04 ENCOUNTER — Other Ambulatory Visit: Payer: Self-pay

## 2018-12-04 VITALS — BP 142/85 | HR 68 | Resp 10 | Ht 70.0 in | Wt 226.0 lb

## 2018-12-04 DIAGNOSIS — R252 Cramp and spasm: Secondary | ICD-10-CM | POA: Diagnosis not present

## 2018-12-04 DIAGNOSIS — I10 Essential (primary) hypertension: Secondary | ICD-10-CM

## 2018-12-04 DIAGNOSIS — F17200 Nicotine dependence, unspecified, uncomplicated: Secondary | ICD-10-CM | POA: Diagnosis not present

## 2018-12-04 DIAGNOSIS — I83893 Varicose veins of bilateral lower extremities with other complications: Secondary | ICD-10-CM

## 2018-12-04 DIAGNOSIS — K219 Gastro-esophageal reflux disease without esophagitis: Secondary | ICD-10-CM | POA: Insufficient documentation

## 2018-12-04 DIAGNOSIS — J449 Chronic obstructive pulmonary disease, unspecified: Secondary | ICD-10-CM | POA: Insufficient documentation

## 2018-12-04 DIAGNOSIS — E785 Hyperlipidemia, unspecified: Secondary | ICD-10-CM | POA: Insufficient documentation

## 2018-12-04 NOTE — Progress Notes (Signed)
SUBJECTIVE:  Patient ID: Keith Shaw, male    DOB: 01/07/1971, 48 y.o.   MRN: [id number] Chief Complaint  Patient presents with  . New Patient (Initial Visit)    HPI  Keith Shaw is a 48 y.o. male that presents today as a new patient with complaints of extensive leg cramping in his right leg.  He states that this is been ongoing for approximately 2 months.  He states that it normally happens in his calf the side of his foot throughout the back of his thigh.  This is a daily occurrence.  He states that this would happen multiple times throughout the evening until he started using a foam that supposedly helps with muscle cramps.  He states that now he has to use his cream at least about 3 times a day in order to control his cramps and even that he still gets some smaller cramps with certain movements such as bending his leg.  He denies any specific movements that make him cramps such as with walking for long distances or up hills.  Patient is a current everyday smoker however he is trying to stop.  The patient also has some very prominent varicose veins on his right lower extremity.  Based on his description he had a superficial venous thrombosis there years ago.  At one point time his legs fall but now it does not swell regularly.  The patient also endorses having back surgery last year some time.  He states that he only rarely has issues with his back at the current time.  Past Medical History:  Diagnosis Date  . COPD (chronic obstructive pulmonary disease) (HCC)   . GERD (gastroesophageal reflux disease)   . Hypertension   . Sleep apnea     Past Surgical History:  Procedure Laterality Date  . LUMBAR LAMINECTOMY/DECOMPRESSION MICRODISCECTOMY N/A 09/25/2017   Procedure: LUMBAR LAMINECTOMY/DECOMPRESSION MICRODISCECTOMY 1 LEVEL-L4;  Surgeon: Ninfa Meeker, MD;  Location: [facility] ORS;  Service: Neurosurgery;  Laterality: N/A;  . NO PAST SURGERIES      Social History    Socioeconomic History  . Marital status: Married    Spouse name: Not on file  . Number of children: Not on file  . Years of education: Not on file  . Highest education level: Not on file  Occupational History  . Not on file  Social Needs  . Financial resource strain: Not on file  . Food insecurity    Worry: Not on file    Inability: Not on file  . Transportation needs    Medical: Not on file    Non-medical: Not on file  Tobacco Use  . Smoking status: Current Every Day Smoker  . Smokeless tobacco: Never Used  Substance and Sexual Activity  . Alcohol use: Yes    Comment: weekends  . Drug use: Yes    Types: Marijuana  . Sexual activity: Not on file  Lifestyle  . Physical activity    Days per week: Not on file    Minutes per session: Not on file  . Stress: Not on file  Relationships  . Social Musician on phone: Not on file    Gets together: Not on file    Attends religious service: Not on file    Active member of club or organization: Not on file    Attends meetings of clubs or organizations: Not on file    Relationship status: Not on file  . Intimate partner  violence    Fear of current or ex partner: Not on file    Emotionally abused: Not on file    Physically abused: Not on file    Forced sexual activity: Not on file  Other Topics Concern  . Not on file  Social History Narrative  . Not on file    History reviewed. No pertinent family history.  Allergies  Allergen Reactions  . Bioflavonoids Swelling    angioedema     Review of Systems   Review of Systems: Negative Unless Checked Constitutional: [] Weight loss  [] Fever  [] Chills Cardiac: [] Chest pain   []  Atrial Fibrillation  [] Palpitations   [] Shortness of breath when laying flat   [] Shortness of breath with exertion. [] Shortness of breath at rest Vascular:  [] Pain in legs with walking   [x] Pain in legs with standing [] Pain in legs when laying flat   [] Claudication    [x] Pain in feet when  laying flat    [] History of DVT   [] Phlebitis   [x] Swelling in legs   [x] Varicose veins   [] Non-healing ulcers Pulmonary:   [] Uses home oxygen   [] Productive cough   [] Hemoptysis   [] Wheeze  [x] COPD   [] Asthma Neurologic:  [] Dizziness   [] Seizures  [] Blackouts [] History of stroke   [] History of TIA  [] Aphasia   [] Temporary Blindness   [] Weakness or numbness in arm   [] Weakness or numbness in leg Musculoskeletal:   [] Joint swelling   [] Joint pain   [x] Low back pain  []  History of Knee Replacement [] Arthritis [x] back Surgeries  []  Spinal Stenosis    Hematologic:  [] Easy bruising  [] Easy bleeding   [] Hypercoagulable state   [] Anemic Gastrointestinal:  [] Diarrhea   [] Vomiting  [] Gastroesophageal reflux/heartburn   [] Difficulty swallowing. [] Abdominal pain Genitourinary:  [] Chronic kidney disease   [] Difficult urination  [] Anuric   [] Blood in urine [] Frequent urination  [] Burning with urination   [] Hematuria Skin:  [] Rashes   [] Ulcers [] Wounds Psychological:  [] History of anxiety   []  History of major depression  []  Memory Difficulties      OBJECTIVE:   Physical Exam  BP (!) 142/85 (BP Location: Left Arm, Patient Position: Sitting, Cuff Size: Normal)   Pulse 68   Resp 10   Ht 5\' 10"  (1.778 m)   Wt 226 lb (102.5 kg)   BMI 32.43 kg/m   Gen: WD/WN, NAD Head: [place]/AT, No temporalis wasting.  Ear/Nose/Throat: Hearing grossly intact, nares w/o erythema or drainage Eyes: PER, EOMI, sclera nonicteric.  Neck: Supple, no masses.  No JVD.  Pulmonary:  Good air movement, no use of accessory muscles.  Cardiac: RRR Vascular:  Prominent varicose vein on right lower extremity approximately 3 to 4 mm Vessel Right Left  Radial Palpable Palpable  Dorsalis Pedis Palpable Palpable  Posterior Tibial Palpable Palpable   Gastrointestinal: soft, non-distended. No guarding/no peritoneal signs.  Musculoskeletal: M/S 5/5 throughout.  No deformity or atrophy.  Neurologic: Pain and light touch intact in  extremities.  Symmetrical.  Speech is fluent. Motor exam as listed above. Psychiatric: Judgment intact, Mood & affect appropriate for pt's clinical situation. Dermatologic: No Venous rashes. No Ulcers Noted.  No changes consistent with cellulitis. Lymph : No Cervical lymphadenopathy, no lichenification or skin changes of chronic lymphedema.       ASSESSMENT AND PLAN:  1. Varicose veins of bilateral lower extremities with other complications Patient has a very prominent varicose vein of his right lower extremity.  Venous reflux may also contribute to some leg cramping therefore we will  also perform a right lower extremity venous reflux study to rule this out as a possible cause. 2. Leg cramps  Recommend:  The patient has atypical pain symptoms for pure atherosclerotic disease. However, on physical exam there is evidence of mixed venous and arterial disease, given the diminished pulses and the edema associated with venous changes of the legs.  Noninvasive studies including ABI's and venous ultrasound of the legs will be obtained and the patient will follow up with me to review these studies.  The patient should continue walking and begin a more formal exercise program. The patient should continue his antiplatelet therapy and aggressive treatment of the lipid abnormalities.  The patient should begin wearing graduated compression socks 15-20 mmHg strength to control edema.   3. Tobacco use disorder Smoking cessation was discussed, 3-10 minutes spent on this topic specifically   4. Essential hypertension Continue antihypertensive medications as already ordered, these medications have been reviewed and there are no changes at this time.    Current Outpatient Medications on File Prior to Visit  Medication Sig Dispense Refill  . acetaminophen (TYLENOL) 500 MG tablet Take 1,000 mg by mouth 2 (two) times daily as needed for moderate pain or headache.    . albuterol (PROVENTIL HFA;VENTOLIN  HFA) 108 (90 Base) MCG/ACT inhaler Inhale 2 puffs into the lungs every 4 (four) hours as needed for wheezing or shortness of breath.    . budesonide-formoterol (SYMBICORT) 160-4.5 MCG/ACT inhaler Inhale 2 puffs into the lungs 2 (two) times daily as needed (shortness of breath).     Marland Kitchen gemfibrozil (LOPID) 600 MG tablet Take 600 mg by mouth 2 (two) times daily.    Marland Kitchen ibuprofen (ADVIL,MOTRIN) 200 MG tablet Take 800 mg by mouth daily as needed for moderate pain.    Marland Kitchen ipratropium-albuterol (DUONEB) 0.5-2.5 (3) MG/3ML SOLN PRN    . lisinopril (PRINIVIL,ZESTRIL) 20 MG tablet Take 10 mg by mouth daily.  11  . Multiple Vitamin (MULTIVITAMIN WITH MINERALS) TABS tablet Take 1 tablet by mouth daily.     No current facility-administered medications on file prior to visit.     There are no Patient Instructions on file for this visit. No follow-ups on file.   Georgiana Spinner, NP  This note was completed with Office manager.  Any errors are purely unintentional.

## 2018-12-06 ENCOUNTER — Encounter (INDEPENDENT_AMBULATORY_CARE_PROVIDER_SITE_OTHER): Payer: BLUE CROSS/BLUE SHIELD | Admitting: Vascular Surgery

## 2018-12-14 ENCOUNTER — Ambulatory Visit (INDEPENDENT_AMBULATORY_CARE_PROVIDER_SITE_OTHER): Payer: BC Managed Care – PPO

## 2018-12-14 ENCOUNTER — Ambulatory Visit (INDEPENDENT_AMBULATORY_CARE_PROVIDER_SITE_OTHER): Payer: BC Managed Care – PPO | Admitting: Nurse Practitioner

## 2018-12-14 ENCOUNTER — Other Ambulatory Visit: Payer: Self-pay

## 2018-12-14 VITALS — BP 136/80 | HR 75 | Resp 16 | Wt 224.0 lb

## 2018-12-14 DIAGNOSIS — I10 Essential (primary) hypertension: Secondary | ICD-10-CM | POA: Diagnosis not present

## 2018-12-14 DIAGNOSIS — R252 Cramp and spasm: Secondary | ICD-10-CM

## 2018-12-14 DIAGNOSIS — I83893 Varicose veins of bilateral lower extremities with other complications: Secondary | ICD-10-CM

## 2018-12-14 DIAGNOSIS — F17200 Nicotine dependence, unspecified, uncomplicated: Secondary | ICD-10-CM

## 2018-12-16 ENCOUNTER — Encounter (INDEPENDENT_AMBULATORY_CARE_PROVIDER_SITE_OTHER): Payer: Self-pay | Admitting: Nurse Practitioner

## 2018-12-16 NOTE — Progress Notes (Signed)
SUBJECTIVE:  Patient ID: Keith Shaw, male    DOB: [birth year], 48 y.o.   MRN: [id number] Chief Complaint  Patient presents with  . Follow-up    ultrasound follow up    HPI  Keith Shaw is a 48 y.o. male that presents today as a new patient with complaints of extensive leg cramping in his right leg.  He states that this is been ongoing for approximately 2 months.  He states that it normally happens in his calf the side of his foot throughout the back of his thigh.  This is a daily occurrence.  He states that this would happen multiple times throughout the evening until he started using a foam that supposedly helps with muscle cramps.  He states that now he has to use his cream at least about 3 times a day in order to control his cramps and even that he still gets some smaller cramps with certain movements such as bending his leg or stretching.  He denies any specific movements that make him cramps such as with walking for long distances or up hills.  Patient is a current everyday smoker however he is trying to stop.  The patient also has some very prominent varicose veins on his right lower extremity.  Based on his description he had a superficial venous thrombosis there years ago.  At one point time his legs fall but now it does not swell regularly.    The patient also endorses having back surgery last year some time.  He states that he only rarely has issues with his back at the current time.  Patient has an ABI 1.23 on the right lower extremity with an ABI of 1.9 on the left.  The patient has triphasic waveforms bilaterally with strong toe waveforms.  The patient has evidence of reflux within his right femoral vein, popliteal vein and great saphenous vein.  There is no evidence of DVT or superficial venous thrombosis. Past Medical History:  Diagnosis Date  . COPD (chronic obstructive pulmonary disease) (HCC)   . GERD (gastroesophageal reflux disease)   .  Hypertension   . Sleep apnea     Past Surgical History:  Procedure Laterality Date  . LUMBAR LAMINECTOMY/DECOMPRESSION MICRODISCECTOMY N/A 09/25/2017   Procedure: LUMBAR LAMINECTOMY/DECOMPRESSION MICRODISCECTOMY 1 LEVEL-L4;  Surgeon: Ninfa Meeker, MD;  Location: [facility] ORS;  Service: Neurosurgery;  Laterality: N/A;  . NO PAST SURGERIES      Social History   Socioeconomic History  . Marital status: Married    Spouse name: Not on file  . Number of children: Not on file  . Years of education: Not on file  . Highest education level: Not on file  Occupational History  . Not on file  Social Needs  . Financial resource strain: Not on file  . Food insecurity    Worry: Not on file    Inability: Not on file  . Transportation needs    Medical: Not on file    Non-medical: Not on file  Tobacco Use  . Smoking status: Current Every Day Smoker  . Smokeless tobacco: Never Used  Substance and Sexual Activity  . Alcohol use: Yes    Comment: weekends  . Drug use: Yes    Types: Marijuana  . Sexual activity: Not on file  Lifestyle  . Physical activity    Days per week: Not on file    Minutes per session: Not on file  . Stress: Not on file  Relationships  .  Social Herbalist on phone: Not on file    Gets together: Not on file    Attends religious service: Not on file    Active member of club or organization: Not on file    Attends meetings of clubs or organizations: Not on file    Relationship status: Not on file  . Intimate partner violence    Fear of current or ex partner: Not on file    Emotionally abused: Not on file    Physically abused: Not on file    Forced sexual activity: Not on file  Other Topics Concern  . Not on file  Social History Narrative  . Not on file    History reviewed. No pertinent family history.  Allergies  Allergen Reactions  . Bioflavonoids Swelling    angioedema     Review of Systems   Review of Systems: Negative Unless Checked  Constitutional: [] Weight loss  [] Fever  [] Chills Cardiac: [] Chest pain   []  Atrial Fibrillation  [] Palpitations   [] Shortness of breath when laying flat   [] Shortness of breath with exertion. [] Shortness of breath at rest Vascular:  [] Pain in legs with walking   [] Pain in legs with standing [] Pain in legs when laying flat   [] Claudication    [] Pain in feet when laying flat    [] History of DVT   [] Phlebitis   [] Swelling in legs   [x] Varicose veins   [] Non-healing ulcers Pulmonary:   [] Uses home oxygen   [] Productive cough   [] Hemoptysis   [] Wheeze  [x] COPD   [] Asthma Neurologic:  [] Dizziness   [] Seizures  [] Blackouts [] History of stroke   [] History of TIA  [] Aphasia   [] Temporary Blindness   [] Weakness or numbness in arm   [] Weakness or numbness in leg Musculoskeletal:   [] Joint swelling   [] Joint pain   [] Low back pain  []  History of Knee Replacement [] Arthritis [] back Surgeries  []  Spinal Stenosis    Hematologic:  [] Easy bruising  [] Easy bleeding   [] Hypercoagulable state   [] Anemic Gastrointestinal:  [] Diarrhea   [] Vomiting  [x] Gastroesophageal reflux/heartburn   [] Difficulty swallowing. [] Abdominal pain Genitourinary:  [] Chronic kidney disease   [] Difficult urination  [] Anuric   [] Blood in urine [] Frequent urination  [] Burning with urination   [] Hematuria Skin:  [] Rashes   [] Ulcers [] Wounds Psychological:  [] History of anxiety   []  History of major depression  []  Memory Difficulties      OBJECTIVE:   Physical Exam  BP 136/80 (BP Location: Right Arm)   Pulse 75   Resp 16   Wt 224 lb (101.6 kg)   BMI 32.14 kg/m   Gen: WD/WN, NAD Head: [place]/AT, No temporalis wasting.  Ear/Nose/Throat: Hearing grossly intact, nares w/o erythema or drainage Eyes: PER, EOMI, sclera nonicteric.  Neck: Supple, no masses.  No JVD.  Pulmonary:  Good air movement, no use of accessory muscles.  Cardiac: RRR Vascular: Large varicosity 6-8 mm on right lower extremity Vessel Right Left  Radial Palpable Palpable   Dorsalis Pedis Palpable Palpable  Posterior Tibial Palpable Palpable   Gastrointestinal: soft, non-distended. No guarding/no peritoneal signs.  Musculoskeletal: M/S 5/5 throughout.  No deformity or atrophy.  Neurologic: Pain and light touch intact in extremities.  Symmetrical.  Speech is fluent. Motor exam as listed above. Psychiatric: Judgment intact, Mood & affect appropriate for pt's clinical situation. Dermatologic: No Venous rashes. No Ulcers Noted.  No changes consistent with cellulitis. Lymph : No Cervical lymphadenopathy, no lichenification or skin changes of chronic lymphedema.  ASSESSMENT AND PLAN:  1. Leg cramps Based upon the patient's non invasive studies as well as his symptom description, PAD is an unlikely cause.  The patient does have a previous history a back surgery.  I have advised him to follow up with his orthopedic surgeon to ensure that this is not related to his back.    2. Varicose veins of bilateral lower extremities with other complications  Recommend:  The patient has large symptomatic varicose veins that are painful and associated with swelling.  I have had a long discussion with the patient regarding  varicose veins and why they cause symptoms.  Patient will begin wearing graduated compression stockings class 1 on a daily basis, beginning first thing in the morning and removing them in the evening. The patient is instructed specifically not to sleep in the stockings.    The patient  will also begin using over-the-counter analgesics such as Motrin 600 mg po TID to help control the symptoms.    In addition, behavioral modification including elevation during the day will be initiated.    Pending the results of these changes the  patient will be reevaluated in three months.    Further plans will be based on the ultrasound results and whether conservative therapies are successful at eliminating the pain and swelling.   3. Essential hypertension  Continue antihypertensive medications as already ordered, these medications have been reviewed and there are no changes at this time.   4. Tobacco use disorder Smoking cessation was discussed, 3-10 minutes spent on this topic specifically    Current Outpatient Medications on File Prior to Visit  Medication Sig Dispense Refill  . acetaminophen (TYLENOL) 500 MG tablet Take 1,000 mg by mouth 2 (two) times daily as needed for moderate pain or headache.    . albuterol (PROVENTIL HFA;VENTOLIN HFA) 108 (90 Base) MCG/ACT inhaler Inhale 2 puffs into the lungs every 4 (four) hours as needed for wheezing or shortness of breath.    . budesonide-formoterol (SYMBICORT) 160-4.5 MCG/ACT inhaler Inhale 2 puffs into the lungs 2 (two) times daily as needed (shortness of breath).     Marland Kitchen. gemfibrozil (LOPID) 600 MG tablet Take 600 mg by mouth 2 (two) times daily.    Marland Kitchen. ibuprofen (ADVIL,MOTRIN) 200 MG tablet Take 800 mg by mouth daily as needed for moderate pain.    Marland Kitchen. ipratropium-albuterol (DUONEB) 0.5-2.5 (3) MG/3ML SOLN PRN    . lisinopril (PRINIVIL,ZESTRIL) 20 MG tablet Take 10 mg by mouth daily.  11  . Multiple Vitamin (MULTIVITAMIN WITH MINERALS) TABS tablet Take 1 tablet by mouth daily.     No current facility-administered medications on file prior to visit.     There are no Patient Instructions on file for this visit. No follow-ups on file.   Georgiana SpinnerFallon E Shaida Route, NP  This note was completed with Office managerDragon Dictation.  Any errors are purely unintentional.

## 2018-12-19 ENCOUNTER — Other Ambulatory Visit: Payer: Self-pay

## 2018-12-19 ENCOUNTER — Ambulatory Visit: Payer: Self-pay

## 2018-12-19 VITALS — BP 142/80 | HR 84 | Resp 16 | Ht 69.0 in | Wt 224.0 lb

## 2018-12-19 DIAGNOSIS — Z23 Encounter for immunization: Secondary | ICD-10-CM

## 2018-12-19 DIAGNOSIS — Z008 Encounter for other general examination: Secondary | ICD-10-CM

## 2018-12-19 LAB — POCT LIPID PANEL
HDL: 49
LDL: 88
Non-HDL: 119
POC Glucose: 135 mg/dl — AB (ref 70–99)
TC/HDL: 3.4
TC: 168
TRG: 158

## 2018-12-19 NOTE — Progress Notes (Signed)
     Patient ID: Keith Shaw, male    DOB: 04-22-1970, 48 y.o.   MRN: [id number]    Thank you!!  [place] Nurse Specialist [place]: [phone]  Cell:  [phone] Website: [email or web address]

## 2018-12-19 NOTE — Patient Instructions (Signed)
Influenza (Flu) Vaccine (Inactivated or Recombinant): What You Need to Know 1. Why get vaccinated? Influenza vaccine can prevent influenza (flu). Flu is a contagious disease that spreads around the [place] every year, usually between October and May. Anyone can get the flu, but it is more dangerous for some people. Infants and young children, people 47 years of age and older, pregnant women, and people with certain health conditions or a weakened immune system are at greatest risk of flu complications. Pneumonia, bronchitis, sinus infections and ear infections are examples of flu-related complications. If you have a medical condition, such as heart disease, cancer or diabetes, flu can make it worse. Flu can cause fever and chills, sore throat, muscle aches, fatigue, cough, headache, and runny or stuffy nose. Some people may have vomiting and diarrhea, though this is more common in children than adults. Each year thousands of people in the [place] die from flu, and many more are hospitalized. Flu vaccine prevents millions of illnesses and flu-related visits to the doctor each year. 2. Influenza vaccine CDC recommends everyone 6 months of age and older get vaccinated every flu season. Children 6 months through 8 years of age may need 2 doses during a single flu season. Everyone else needs only 1 dose each flu season. It takes about 2 weeks for protection to develop after vaccination. There are many flu viruses, and they are always changing. Each year a new flu vaccine is made to protect against three or four viruses that are likely to cause disease in the upcoming flu season. Even when the vaccine doesn't exactly match these viruses, it may still provide some protection. Influenza vaccine does not cause flu. Influenza vaccine may be given at the same time as other vaccines. 3. Talk with your health care provider Tell your vaccine provider if the person getting the vaccine:  Has had an  allergic reaction after a previous dose of influenza vaccine, or has any severe, life-threatening allergies.  Has ever had Guillain-Barr Syndrome (also called GBS). In some cases, your health care provider may decide to postpone influenza vaccination to a future visit. People with minor illnesses, such as a cold, may be vaccinated. People who are moderately or severely ill should usually wait until they recover before getting influenza vaccine. Your health care provider can give you more information. 4. Risks of a vaccine reaction  Soreness, redness, and swelling where shot is given, fever, muscle aches, and headache can happen after influenza vaccine.  There may be a very small increased risk of Guillain-Barr Syndrome (GBS) after inactivated influenza vaccine (the flu shot). Young children who get the flu shot along with pneumococcal vaccine (PCV13), and/or DTaP vaccine at the same time might be slightly more likely to have a seizure caused by fever. Tell your health care provider if a child who is getting flu vaccine has ever had a seizure. People sometimes faint after medical procedures, including vaccination. Tell your provider if you feel dizzy or have vision changes or ringing in the ears. As with any medicine, there is a very remote chance of a vaccine causing a severe allergic reaction, other serious injury, or death. 5. What if there is a serious problem? An allergic reaction could occur after the vaccinated person leaves the clinic. If you see signs of a severe allergic reaction (hives, swelling of the face and throat, difficulty breathing, a fast heartbeat, dizziness, or weakness), call 9-1-1 and get the person to the nearest hospital. For other signs that   concern you, call your health care provider. Adverse reactions should be reported to the Vaccine Adverse Event Reporting System (VAERS). Your health care provider will usually file this report, or you can do it yourself. Visit the  VAERS website at www.vaers.hhs.gov or call 1-800-822-7967.VAERS is only for reporting reactions, and VAERS staff do not give medical advice. 6. The National Vaccine Injury Compensation Program The National Vaccine Injury Compensation Program (VICP) is a federal program that was created to compensate people who may have been injured by certain vaccines. Visit the VICP website at www.hrsa.gov/vaccinecompensation or call 1-800-338-2382 to learn about the program and about filing a claim. There is a time limit to file a claim for compensation. 7. How can I learn more?  Ask your healthcare provider.  Call your local or state health department.  Contact the Centers for Disease Control and Prevention (CDC): ? Call 1-800-232-4636 (1-800-CDC-INFO) or ? Visit CDC's www.cdc.gov/flu Vaccine Information Statement (Interim) Inactivated Influenza Vaccine (10/26/2017) This information is not intended to replace advice given to you by your health care provider. Make sure you discuss any questions you have with your health care provider. Document Released: 12/23/2005 Document Revised: 06/19/2018 Document Reviewed: 10/30/2017 Elsevier Patient Education  2020 [employer] Preventing Influenza, Adult Influenza, more commonly known as "the flu," is a viral infection that mainly affects the respiratory tract. The respiratory tract includes structures that help you breathe, such as the lungs, nose, and throat. The flu causes many common cold symptoms, as well as a high fever and body aches. The flu spreads easily from person to person (is contagious). The flu is most common from December through March. This is called flu season.You can catch the flu virus by:  Breathing in droplets from an infected person's cough or sneeze.  Touching something that was recently contaminated with the virus and then touching your mouth, nose, or eyes. What can I do to lower my risk?        You can decrease your risk of getting  the flu by:  Getting a flu shot (influenza vaccination) every year. This is the best way to prevent the flu. A flu shot is recommended for everyone age 6 months and older. ? It is best to get a flu shot in the fall, as soon as it is available. Getting a flu shot during winter or spring instead is still a good idea. Flu season can last into early spring. ? Preventing the flu through vaccination requires getting a new flu shot every year. This is because the flu virus changes slightly (mutates) from one year to the next. Even if a flu shot does not completely protect you from all flu virus mutations, it can reduce the severity of your illness and prevent dangerous complications of the flu. ? If you are pregnant, you can and should get a flu shot. ? If you have had a reaction to the shot in the past or if you are allergic to eggs, check with your health care provider before getting a flu shot. ? Sometimes the vaccine is available as a nasal spray. In some years, the nasal spray has not been as effective against the flu virus. Check with your health care provider if you have questions about this.  Practicing good health habits. This is especially important during flu season. ? Avoid contact with people who are sick with flu or cold symptoms. ? Wash your hands with soap and water often. If soap and water are not available, use alcohol-based   hand sanitizer. ? Avoid touching your hands to your face, especially when you have not washed your hands recently. ? Use a disinfectant to clean surfaces at home and at work that may be contaminated with the flu virus. ? Keep your body's disease-fighting system (immune system) in good shape by eating a healthy diet, drinking plenty of fluids, getting enough sleep, and exercising regularly. If you do get the flu, avoid spreading it to others by:  Staying home until your symptoms have been gone for at least one day.  Covering your mouth and nose when you cough or  sneeze.  Avoiding close contact with others, especially babies and elderly people. Why are these changes important? Getting a flu shot and practicing good health habits protects you as well as other people. If you get the flu, your friends, family, and co-workers are also at risk of getting it, because it spreads so easily to others. Each year, about 2 out of every 10 people get the flu. Having the flu can lead to complications, such as pneumonia, ear infection, and sinus infection. The flu also can be deadly, especially for babies, people older than age 47, and people who have serious long-term diseases. How is this treated? Most people recover from the flu by resting at home and drinking plenty of fluids. However, a prescription antiviral medicine may reduce your flu symptoms and may make your flu go away sooner. This medicine must be started within a few days of getting flu symptoms. You can talk with your health care provider about whether you need an antiviral medicine. Antiviral medicine may be prescribed for people who are at risk for more serious flu symptoms. This includes people who:  Are older than age 47.  Are pregnant.  Have a condition that makes the flu worse or more dangerous. Where to find more information  Centers for Disease Control and Prevention: www.cdc.gov/flu/index.htm  Flu.gov: www.flu.gov/prevention-vaccination  [employer]: familydoctor.org/familydoctor/en/kids/vaccines/preventing-the-flu.html Contact a health care provider if:  You have influenza and you develop new symptoms.  You have: ? Chest pain. ? Diarrhea. ? A fever.  Your cough gets worse, or you produce more mucus. Summary  The best way to prevent the flu is to get a flu shot every year in the fall.  Even if you get the flu after you have received the yearly vaccine, your flu may be milder and go away sooner because of your flu shot.  If you get the flu, antiviral  medicines that are started with a few days of symptoms may reduce your flu symptoms and may make your flu go away sooner.  You can also help prevent the flu by practicing good health habits. This information is not intended to replace advice given to you by your health care provider. Make sure you discuss any questions you have with your health care provider. Document Released: 03/15/2015 Document Revised: 02/10/2017 Document Reviewed: 11/07/2015 Elsevier Patient Education  2020 [employer]  

## 2018-12-28 IMAGING — CR DG CHEST 2V
2 series · 2 of 2 positions shown · non-contrast
Comparison: None.

CLINICAL DATA: COPD. Smoker. Pre-op respiratory exam for spine
surgery.

EXAM:
CHEST - 2 VIEW

[chest pa]
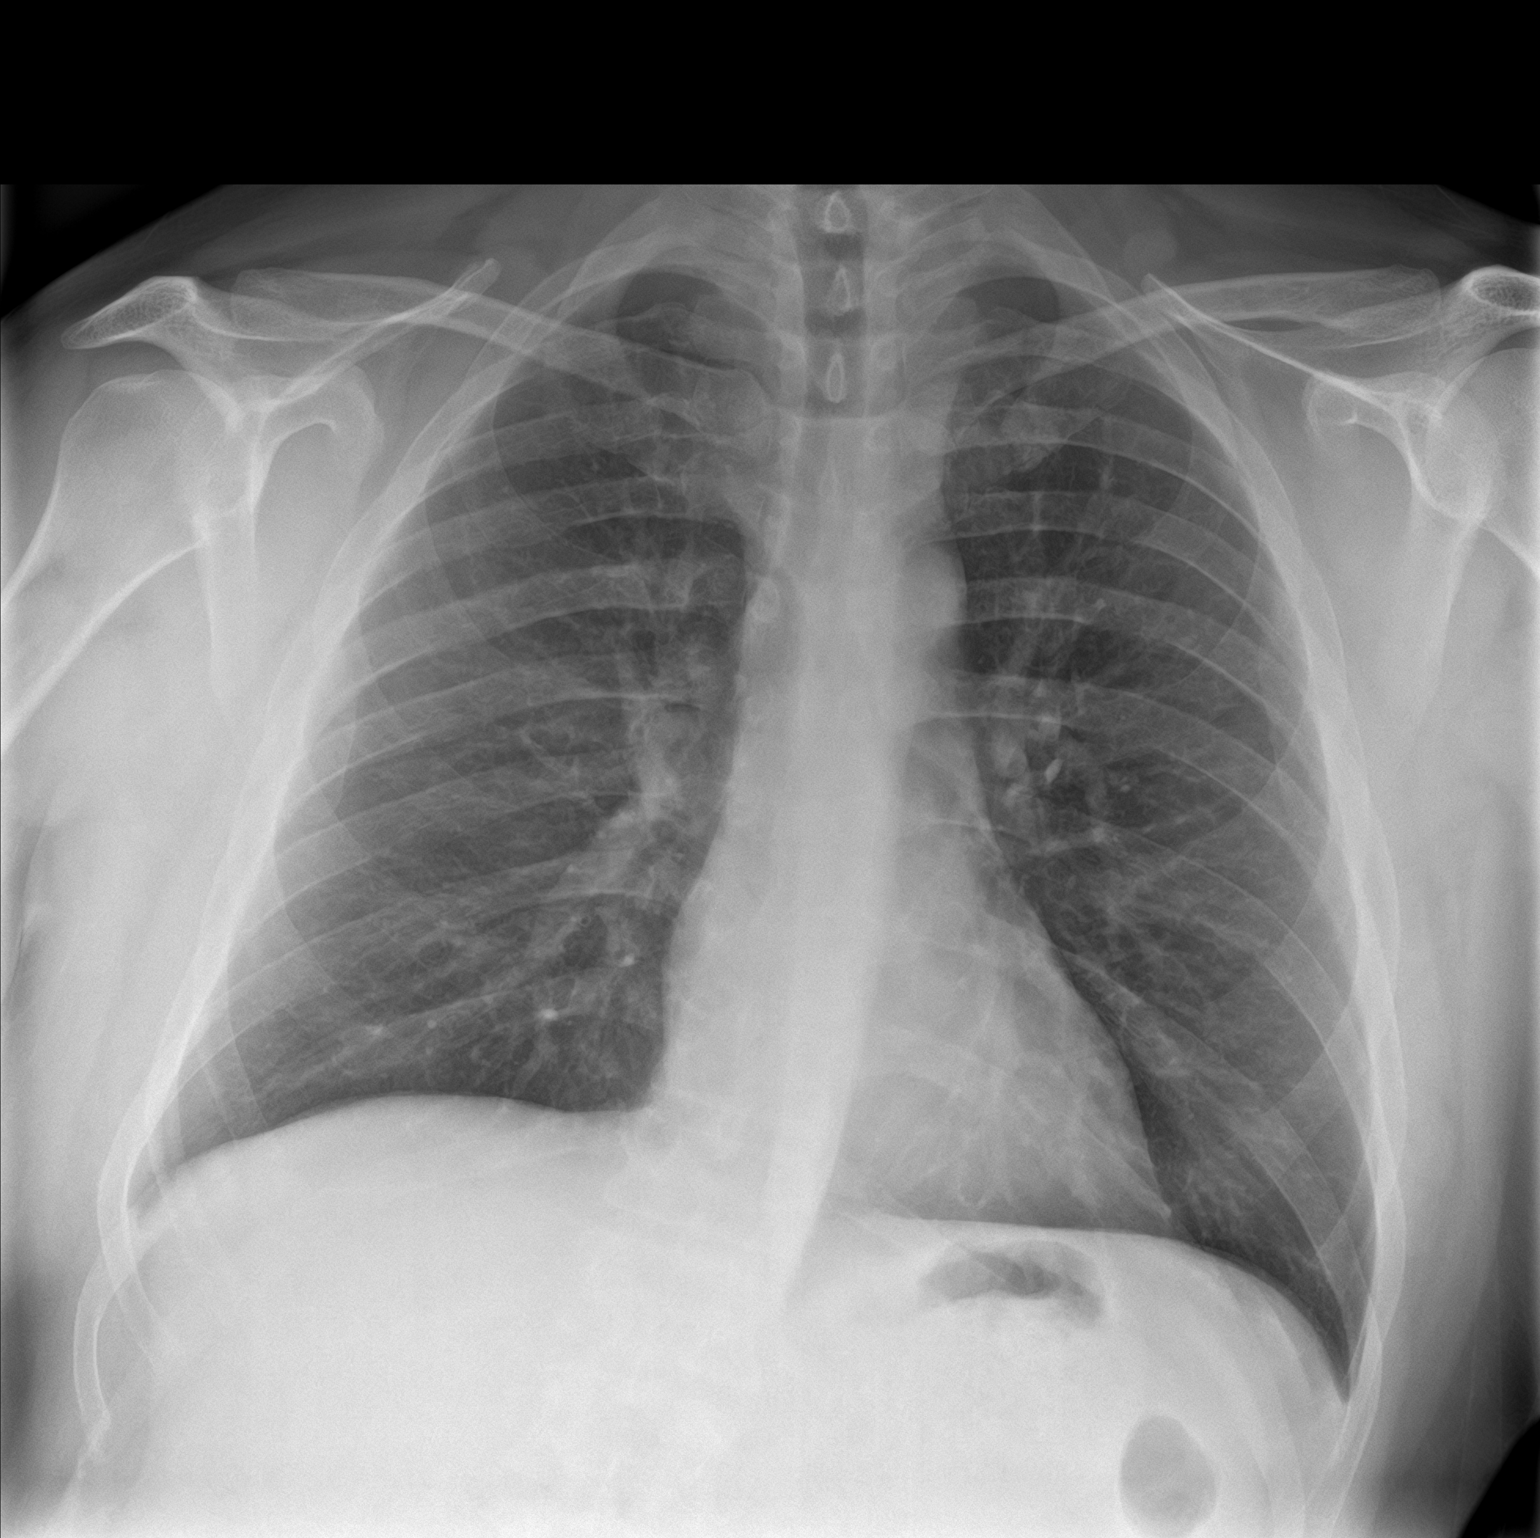

[chest lat]
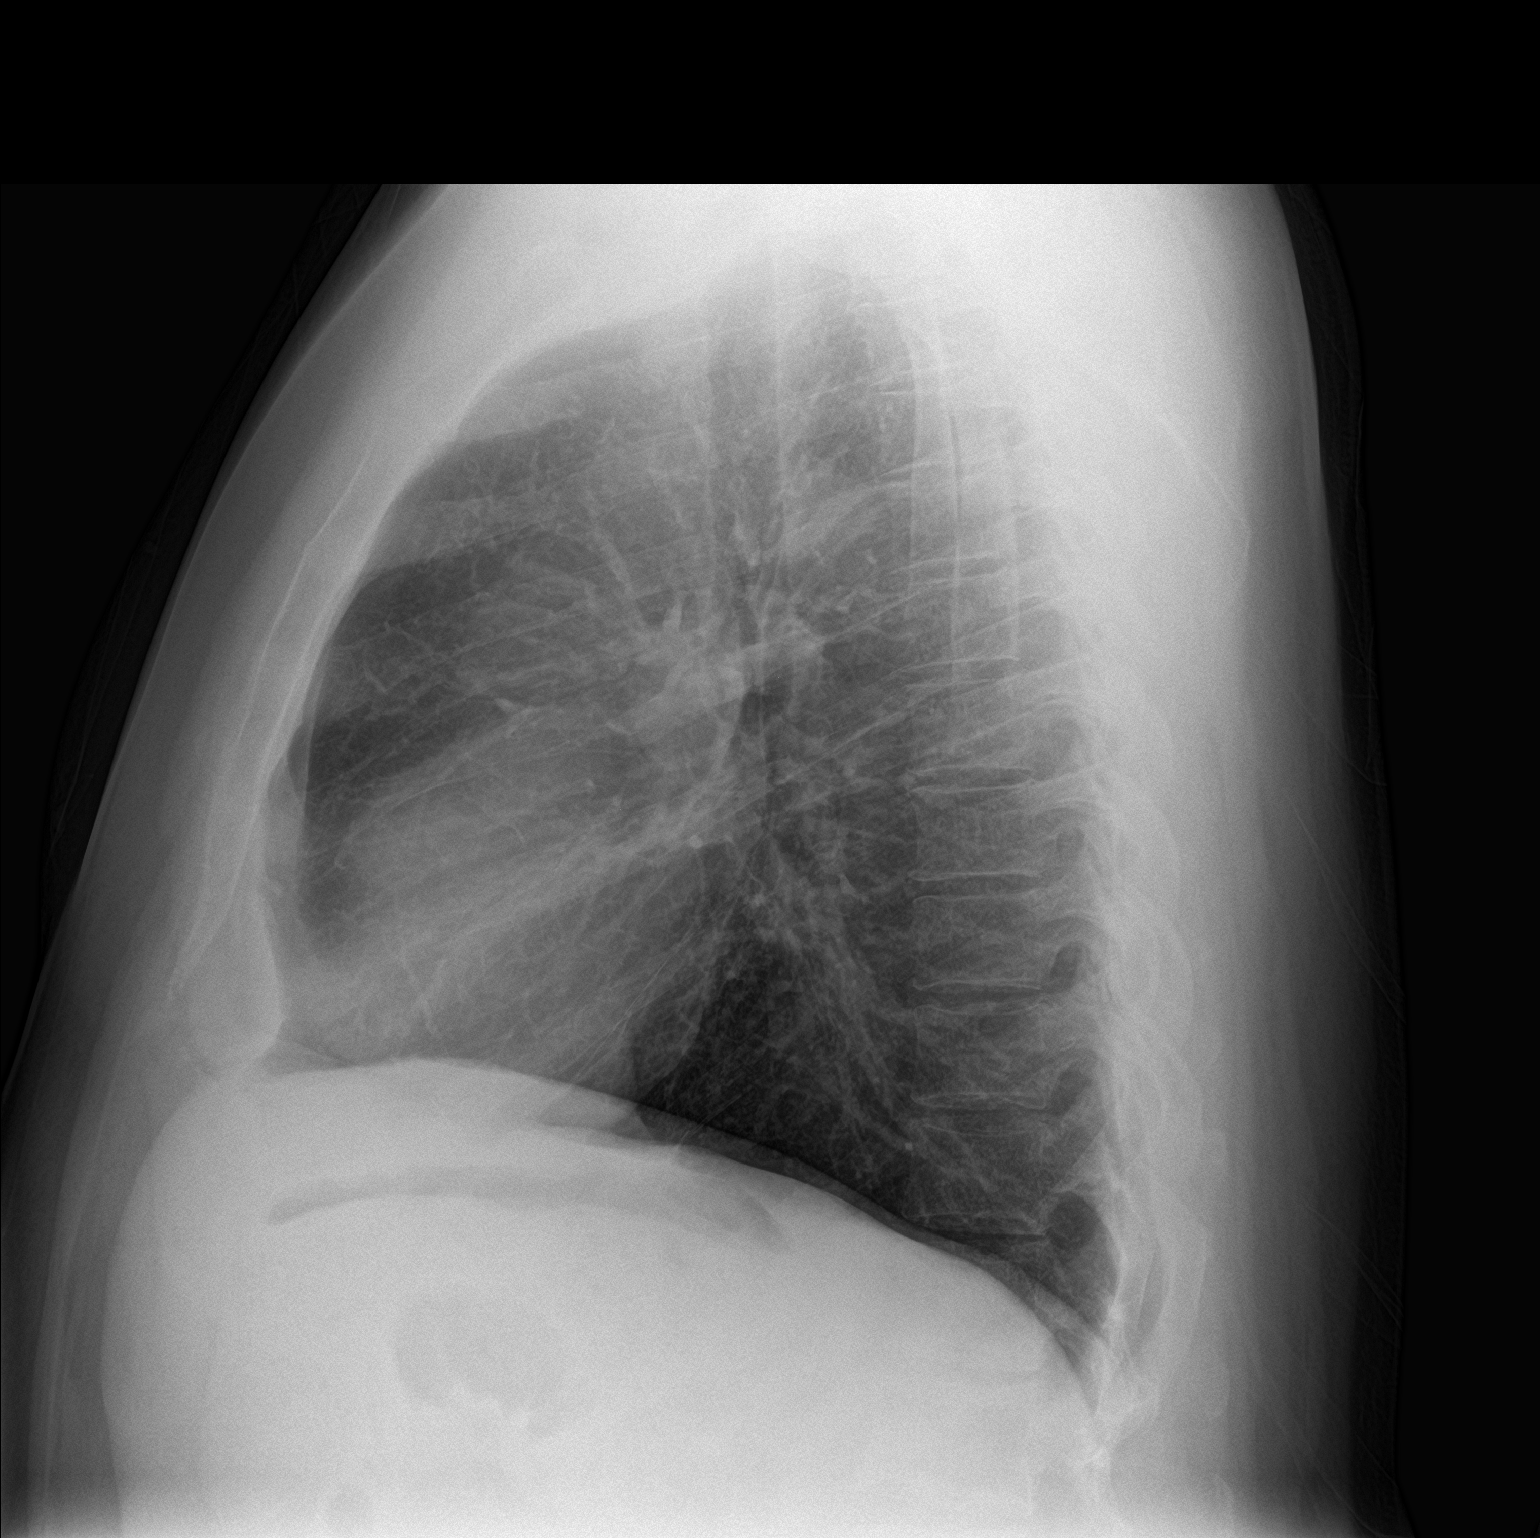

[2 of 2 positions shown; findings below may reference images not displayed]

FINDINGS: The heart size and mediastinal contours are within normal limits.
Both lungs are clear. The visualized skeletal structures are
unremarkable.
IMPRESSION: No active cardiopulmonary disease.

## 2019-03-12 ENCOUNTER — Ambulatory Visit (INDEPENDENT_AMBULATORY_CARE_PROVIDER_SITE_OTHER): Payer: BC Managed Care – PPO | Admitting: Vascular Surgery
